# Patient Record
Sex: Female | Born: 2000 | Race: Black or African American | Hispanic: No | Marital: Single
Health system: Midwestern US, Community
[De-identification: ages and names within clinical notes are randomized; demographics above are authoritative.]

## PROBLEM LIST (undated history)

## (undated) DIAGNOSIS — Z789 Other specified health status: Secondary | ICD-10-CM

---
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
## 2020-04-09 ENCOUNTER — Other Ambulatory Visit: Payer: Self-pay

## 2020-04-09 DIAGNOSIS — Z793 Long term (current) use of hormonal contraceptives: Secondary | ICD-10-CM

## 2020-04-09 DIAGNOSIS — R0781 Pleurodynia: Secondary | ICD-10-CM | POA: Diagnosis not present

## 2020-04-09 DIAGNOSIS — R509 Fever, unspecified: Secondary | ICD-10-CM | POA: Diagnosis present

## 2020-04-09 DIAGNOSIS — I2699 Other pulmonary embolism without acute cor pulmonale: Principal | ICD-10-CM | POA: Diagnosis present

## 2020-04-09 DIAGNOSIS — Z20822 Contact with and (suspected) exposure to covid-19: Secondary | ICD-10-CM | POA: Diagnosis present

## 2020-04-09 DIAGNOSIS — D72829 Elevated white blood cell count, unspecified: Secondary | ICD-10-CM | POA: Diagnosis present

## 2020-04-09 DIAGNOSIS — Z836 Family history of other diseases of the respiratory system: Secondary | ICD-10-CM

## 2020-04-10 ENCOUNTER — Other Ambulatory Visit: Payer: Self-pay

## 2020-04-10 ENCOUNTER — Emergency Department (HOSPITAL_COMMUNITY): Payer: No Typology Code available for payment source

## 2020-04-10 ENCOUNTER — Encounter (HOSPITAL_COMMUNITY): Payer: Self-pay | Admitting: *Deleted

## 2020-04-10 ENCOUNTER — Inpatient Hospital Stay (HOSPITAL_COMMUNITY)
Admission: EM | Admit: 2020-04-10 | Discharge: 2020-04-12 | DRG: 176 | Disposition: A | Discharge status: Home / Self Care | Payer: No Typology Code available for payment source | Attending: Internal Medicine | Admitting: Internal Medicine

## 2020-04-10 DIAGNOSIS — I2699 Other pulmonary embolism without acute cor pulmonale: Secondary | ICD-10-CM | POA: Diagnosis not present

## 2020-04-10 HISTORY — DX: Other specified health status: Z78.9

## 2020-04-10 LAB — COMPREHENSIVE METABOLIC PANEL
ALT: 71 U/L — ABNORMAL HIGH (ref 0–44)
AST: 42 U/L — ABNORMAL HIGH (ref 15–41)
Albumin: 3.9 g/dL (ref 3.5–5.0)
Alkaline Phosphatase: 76 U/L (ref 38–126)
Anion gap: 12 (ref 5–15)
BUN: 11 mg/dL (ref 6–20)
CO2: 22 mmol/L (ref 22–32)
Calcium: 9.3 mg/dL (ref 8.9–10.3)
Chloride: 106 mmol/L (ref 98–111)
Creatinine, Ser: 0.74 mg/dL (ref 0.44–1.00)
GFR calc Af Amer: >60 mL/min (ref >60)
GFR calc non Af Amer: >60 mL/min (ref >60)
Glucose, Bld: 100 mg/dL — ABNORMAL HIGH (ref 70–99)
Potassium: 3.8 mmol/L (ref 3.5–5.1)
Sodium: 140 mmol/L (ref 135–145)
Total Bilirubin: 0.8 mg/dL (ref 0.3–1.2)
Total Protein: 7.6 g/dL (ref 6.5–8.1)

## 2020-04-10 LAB — CBC WITH DIFFERENTIAL/PLATELET
Abs Immature Granulocytes: 0.03 10*3/uL (ref 0.00–0.07)
Basophils Absolute: 0 10*3/uL (ref 0.0–0.1)
Basophils Relative: 0 %
Eosinophils Absolute: 0.3 10*3/uL (ref 0.0–0.5)
Eosinophils Relative: 2 %
HCT: 39.4 % (ref 36.0–46.0)
Hemoglobin: 12.6 g/dL (ref 12.0–15.0)
Immature Granulocytes: 0 %
Lymphocytes Relative: 27 %
Lymphs Abs: 3.2 10*3/uL (ref 0.7–4.0)
MCH: 27.8 pg (ref 26.0–34.0)
MCHC: 32 g/dL (ref 30.0–36.0)
MCV: 87 fL (ref 80.0–100.0)
Monocytes Absolute: 1.2 10*3/uL — ABNORMAL HIGH (ref 0.1–1.0)
Monocytes Relative: 10 %
Neutro Abs: 7.1 10*3/uL (ref 1.7–7.7)
Neutrophils Relative %: 61 %
Platelets: 224 10*3/uL (ref 150–400)
RBC: 4.53 MIL/uL (ref 3.87–5.11)
RDW: 13.3 % (ref 11.5–15.5)
WBC: 11.9 10*3/uL — ABNORMAL HIGH (ref 4.0–10.5)
nRBC: 0 % (ref 0.0–0.2)

## 2020-04-10 LAB — SARS CORONAVIRUS 2 BY RT PCR (HOSPITAL ORDER, PERFORMED IN ~~LOC~~ HOSPITAL LAB): SARS Coronavirus 2: NEGATIVE

## 2020-04-10 LAB — HEPARIN LEVEL (UNFRACTIONATED)
Heparin Unfractionated: 0.74 IU/mL — ABNORMAL HIGH (ref 0.30–0.70)
Heparin Unfractionated: 0.63 IU/mL (ref 0.30–0.70)
Heparin Unfractionated: 0.58 IU/mL (ref 0.30–0.70)

## 2020-04-10 LAB — PROTIME-INR
INR: 1.2 (ref 0.8–1.2)
Prothrombin Time: 14.6 seconds (ref 11.4–15.2)

## 2020-04-10 LAB — BRAIN NATRIURETIC PEPTIDE: B Natriuretic Peptide: 18.2 pg/mL (ref 0.0–100.0)

## 2020-04-10 LAB — TROPONIN I (HIGH SENSITIVITY): Troponin I (High Sensitivity): <2 ng/L (ref <18)

## 2020-04-10 LAB — D-DIMER, QUANTITATIVE: D-Dimer, Quant: 1.31 ug/mL-FEU — ABNORMAL HIGH (ref 0.00–0.50)

## 2020-04-10 LAB — HIV ANTIBODY (ROUTINE TESTING W REFLEX): HIV Screen 4th Generation wRfx: NONREACTIVE

## 2020-04-10 LAB — APTT: aPTT: 31 seconds (ref 24–36)

## 2020-04-10 LAB — LIPASE, BLOOD: Lipase: 25 U/L (ref 11–51)

## 2020-04-10 MED ORDER — ONDANSETRON HCL 4 MG/2ML IJ SOLN
4.0000 mg | Freq: Once | INTRAMUSCULAR | Status: AC
  Administered 2020-04-10: 4 mg via INTRAVENOUS
  Filled 2020-04-10: qty 2

## 2020-04-10 MED ORDER — SODIUM CHLORIDE 0.9% FLUSH
3.0000 mL | Freq: Two times a day (BID) | INTRAVENOUS | Status: DC
  Administered 2020-04-10 – 2020-04-12 (×3): 3 mL via INTRAVENOUS

## 2020-04-10 MED ORDER — ONDANSETRON HCL 4 MG/2ML IJ SOLN
4.0000 mg | Freq: Four times a day (QID) | INTRAMUSCULAR | Status: DC | PRN
  Administered 2020-04-10 (×2): 4 mg via INTRAVENOUS
  Filled 2020-04-10 (×2): qty 2

## 2020-04-10 MED ORDER — ONDANSETRON HCL 4 MG PO TABS: 4.0000 mg | ORAL_TABLET | Freq: Four times a day (QID) | ORAL | Status: DC | PRN

## 2020-04-10 MED ORDER — MORPHINE SULFATE (PF) 4 MG/ML IV SOLN
4.0000 mg | Freq: Once | INTRAVENOUS | Status: AC
  Administered 2020-04-10: 4 mg via INTRAVENOUS
  Filled 2020-04-10: qty 1

## 2020-04-10 MED ORDER — SODIUM CHLORIDE (PF) 0.9 % IJ SOLN
INTRAMUSCULAR | Status: AC
  Filled 2020-04-10: qty 50

## 2020-04-10 MED ORDER — SODIUM CHLORIDE 0.9% FLUSH: 3.0000 mL | INTRAVENOUS | Status: DC | PRN

## 2020-04-10 MED ORDER — ACETAMINOPHEN 650 MG RE SUPP: 650.0000 mg | Freq: Four times a day (QID) | RECTAL | Status: DC | PRN

## 2020-04-10 MED ORDER — HEPARIN (PORCINE) 25000 UT/250ML-% IV SOLN
1100.0000 [IU]/h | INTRAVENOUS | Status: AC
  Administered 2020-04-10 (×2): 1150 [IU]/h via INTRAVENOUS
  Filled 2020-04-10 (×2): qty 250

## 2020-04-10 MED ORDER — SODIUM CHLORIDE 0.9 % IV SOLN: 250.0000 mL | INTRAVENOUS | Status: DC | PRN

## 2020-04-10 MED ORDER — HYDROCODONE-ACETAMINOPHEN 5-325 MG PO TABS
1.0000 | ORAL_TABLET | Freq: Four times a day (QID) | ORAL | Status: DC | PRN
  Administered 2020-04-10: 1 via ORAL
  Filled 2020-04-10: qty 1

## 2020-04-10 MED ORDER — HEPARIN BOLUS VIA INFUSION
3000.0000 [IU] | Freq: Once | INTRAVENOUS | Status: AC
  Administered 2020-04-10: 3000 [IU] via INTRAVENOUS
  Filled 2020-04-10: qty 3000

## 2020-04-10 MED ORDER — SENNOSIDES-DOCUSATE SODIUM 8.6-50 MG PO TABS: 1.0000 | ORAL_TABLET | Freq: Every evening | ORAL | Status: DC | PRN

## 2020-04-10 MED ORDER — HYDROCODONE-ACETAMINOPHEN 5-325 MG PO TABS
1.0000 | ORAL_TABLET | ORAL | Status: DC | PRN
  Administered 2020-04-10 – 2020-04-11 (×4): 1 via ORAL
  Filled 2020-04-10 (×4): qty 1

## 2020-04-10 MED ORDER — IOHEXOL 350 MG/ML SOLN
100.0000 mL | Freq: Once | INTRAVENOUS | Status: AC | PRN
  Administered 2020-04-10: 100 mL via INTRAVENOUS

## 2020-04-10 MED ORDER — HYDROCODONE-ACETAMINOPHEN 5-325 MG PO TABS
1.0000 | ORAL_TABLET | ORAL | Status: DC | PRN
  Administered 2020-04-10: 1 via ORAL
  Filled 2020-04-10: qty 1

## 2020-04-10 MED ORDER — SODIUM CHLORIDE 0.9% FLUSH
3.0000 mL | Freq: Two times a day (BID) | INTRAVENOUS | Status: DC
  Administered 2020-04-11: 3 mL via INTRAVENOUS

## 2020-04-10 MED ORDER — ACETAMINOPHEN 325 MG PO TABS
650.0000 mg | ORAL_TABLET | Freq: Four times a day (QID) | ORAL | Status: DC | PRN
  Administered 2020-04-10 – 2020-04-11 (×2): 650 mg via ORAL
  Filled 2020-04-10 (×2): qty 2

## 2020-04-10 MED ORDER — MORPHINE SULFATE (PF) 4 MG/ML IV SOLN
4.0000 mg | INTRAVENOUS | Status: DC | PRN
  Administered 2020-04-10 (×2): 4 mg via INTRAVENOUS
  Filled 2020-04-10 (×2): qty 1

## 2020-04-10 MED ORDER — BISACODYL 5 MG PO TBEC: 5.0000 mg | DELAYED_RELEASE_TABLET | Freq: Every day | ORAL | Status: DC | PRN

## 2020-04-10 NOTE — Progress Notes (Signed)
Patient seen and examined personally, I reviewed the chart, history and physical and admission note, done by admitting physician this morning and agree with the same with following addendum.  Please refer to the morning admission note for more detailed plan of care.  On exam she is alert awake oriented, lungs are clear on exam has pleuritic chest pain on right lower chest. No leg edema. No abdominal tenderness.  C/o right lower chest pain 5-6/10 just with movement. On RA.   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  Reports she has been on on Nuvaring (etonogestrel/ethinyl estradiol) for birth control q 4 wk- stays for 21 days and off for 1 wk. And has been on that since 2016  PE with infarct with pleuritic chest pain: Continue IV heparin infusion we discussed about conversion to DOAC preferably Xarelto.  No recent surgery injury or immobilization but she does take Nuvaring birth control vaginal ring  With etonogestrel/ethinyl estradiol-and I suspect this is likely the etiology.  Patient going to follow-up with her OB/GYN regarding discontinuation.  Well to seek alternative method of birth control.  She will need follow-up with hematology for further work-up if warranted has family history of PE in maternal grandfather.  Patient remains hospitalized for ongoing treatment of PE with IV heparin, pain control.

## 2020-04-10 NOTE — Progress Notes (Signed)
NUVARING removed per pt GYN in [place], Dr. Baxter Kail [phone]. Pt self removed. SRP, RN

## 2020-04-10 NOTE — Progress Notes (Signed)
ANTICOAGULATION CONSULT NOTE - Follow Up Consult  Pharmacy Consult for Heparin Indication: pulmonary embolus  No Known Allergies  Patient Measurements: Height: 5\' 2"  (157.5 cm) Weight: 58.2 kg (128 lb 4.9 oz) IBW/kg (Calculated) : 50.1 Heparin Dosing Weight: actual weight  Vital Signs: Temp: 99.3 F (37.4 C) (05/24 1758) Temp Source: Oral (05/24 1425) BP: 114/75 (05/24 1758) Pulse Rate: 85 (05/24 1758)  Labs: Recent Labs    04/10/20 0129 04/10/20 0455 04/10/20 04/12/20 04/10/20 1234 04/10/20 1822  HGB 12.6  --   --   --   --   HCT 39.4  --   --   --   --   PLT 224  --   --   --   --   APTT  --  31  --   --   --   LABPROT  --  14.6  --   --   --   INR  --  1.2  --   --   --   HEPARINUNFRC  --   --  0.74* 0.63 0.58  CREATININE 0.74  --   --   --   --   TROPONINIHS  --   --  <2  --   --     Estimated Creatinine Clearance: 88 mL/min (by C-G formula based on SCr of 0.74 mg/dL).   Medications:  Scheduled:  . sodium chloride flush  3 mL Intravenous Q12H  . sodium chloride flush  3 mL Intravenous Q12H   Infusions:  . sodium chloride    . heparin 1,150 Units/hr (04/10/20 0504)   PRN: sodium chloride, acetaminophen **OR** acetaminophen, bisacodyl, HYDROcodone-acetaminophen, morphine injection, ondansetron **OR** ondansetron (ZOFRAN) IV, senna-docusate, sodium chloride flush  Assessment: 19 yo female admitted with +PE in RLL segmental artery with infarction.  Pharmacy is consulted to dose IV heparin.  Today, 04/10/2020  Heparin level 0.63 is therapeutic on 1150 units/hr  CBC wnl  No bleeding or complications reported  2nd shift follow-up:  Confirmatory Heparin level 0.58 therapeutic on 1150 units/hr  No complications of therapy noted  Goal of Therapy:  Heparin level 0.3-0.7 units/ml Monitor platelets by anticoagulation protocol: Yes   Plan:   Continue IV heparin infusion at 1150 units/hr  Daily heparin level and CBC  Monitor for signs/symptoms of  bleeding  F/u transition to oral anticoagulant  04/12/2020, PharmD 04/10/2020,8:12 PM

## 2020-04-10 NOTE — H&P (Signed)
History and Physical    Briana Villegas LPF:[id number] DOB: 19-Jul-2001 DOA: 04/10/2020  PCP: System, Pcp Not In   Patient coming from: Home   Chief Complaint: Pain under right breast   HPI: Briana Villegas is a 19 y.o. female denies any significant past medical history and now presents the emergency department with 1 day of pain under her right breast.  Patient reports that she been in her usual state of health until she noticed some pain under the right breast yesterday morning.  Pain has been constant, radiating around to her back, worse with deep breath, and without any associated shortness of breath, cough, or lightheadedness.  She denies any lower extremity swelling or tenderness.  She denies any recent injury, surgery, or prolonged immobilization.  She is a non-smoker.  She reports a family history of PE in her maternal grandfather.  No history of significant bleeding.  ED Course: Upon arrival to the ED, patient is found to be afebrile, saturating 98% on room air, normal heart and respiratory rates, and stable blood pressure.  CBC notable for mild leukocytosis and chemistry panel with mild elevation in transaminases.  Chest x-ray with no acute cardiopulmonary disease.  CTA chest notable for pulmonary embolism involving the right lower lobe segmental artery with suspected infarct.  COVID-19 screening test was negative.  Patient was started on IV heparin in the ED.  Review of Systems:  All other systems reviewed and apart from HPI, are negative.  Past Medical History:  Diagnosis Date  . Medical history non-contributory     History reviewed. No pertinent surgical history.   reports that she has never smoked. She has never used smokeless tobacco. She reports current alcohol use. She reports previous drug use.  No Known Allergies  Family History  Problem Relation Age of Onset  . Pulmonary embolism Maternal Grandfather      Prior to Admission medications   Medication Sig Start  Date End Date Taking? Authorizing Provider  etonogestrel-ethinyl estradiol (NUVARING) 0.12-0.015 MG/24HR vaginal ring Place 1 each vaginally every 21 ( twenty-one) days. Insert 1 ring vaginally for 3 weeks then remove for 1 week 03/06/20  Yes [provider]  ibuprofen (ADVIL) 200 MG tablet Take 400 mg by mouth every 6 (six) hours as needed for fever, headache, mild pain, moderate pain or cramping.   Yes [provider]    Physical Exam: Vitals:   04/10/20 0002 04/10/20 0003 04/10/20 0206 04/10/20 0400  BP: 123/77  114/76 117/85  Pulse: 89  80 85  Resp: 16  16 19   Temp: 98.1 F (36.7 C)     TempSrc: Oral     SpO2: 99%  99% 98%  Weight:  59.4 kg    Height:  5\' 2"  (1.575 m)       Constitutional: NAD, calm  Eyes: PERTLA, lids and conjunctivae normal ENMT: Mucous membranes are moist. Posterior pharynx clear of any exudate or lesions.   Neck: normal, supple, no masses, no thyromegaly Respiratory: no wheezing, no crackles. No accessory muscle use.  Cardiovascular: Rate ~80 and regular. No extremity edema. No significant JVD. Abdomen: No distension, no tenderness, soft. Bowel sounds active.  Musculoskeletal: no clubbing / cyanosis. No joint deformity upper and lower extremities.   Skin: no significant rashes, lesions, ulcers. Warm, dry, well-perfused. Neurologic: no facial asymmetry. Sensation intact. Moving all extremities.  Psychiatric: Alert and oriented to person, place, and situation. Pleasant and cooperative.    Labs and Imaging on Admission: I have personally reviewed following  labs and imaging studies  CBC: Recent Labs  Lab 04/10/20 0129  WBC 11.9*  NEUTROABS 7.1  HGB 12.6  HCT 39.4  MCV 87.0  PLT 224   Basic Metabolic Panel: Recent Labs  Lab 04/10/20 0129  NA 140  K 3.8  CL 106  CO2 22  GLUCOSE 100*  BUN 11  CREATININE 0.74  CALCIUM 9.3   GFR: Estimated Creatinine Clearance: 88 mL/min (by C-G formula based on SCr of 0.74 mg/dL). Liver  Function Tests: Recent Labs  Lab 04/10/20 0129  AST 42*  ALT 71*  ALKPHOS 76  BILITOT 0.8  PROT 7.6  ALBUMIN 3.9   Recent Labs  Lab 04/10/20 0129  LIPASE 25   No results for input(s): AMMONIA in the last 168 hours. Coagulation Profile: No results for input(s): INR, PROTIME in the last 168 hours. Cardiac Enzymes: No results for input(s): CKTOTAL, CKMB, CKMBINDEX, TROPONINI in the last 168 hours. BNP (last 3 results) No results for input(s): PROBNP in the last 8760 hours. HbA1C: No results for input(s): HGBA1C in the last 72 hours. CBG: No results for input(s): GLUCAP in the last 168 hours. Lipid Profile: No results for input(s): CHOL, HDL, LDLCALC, TRIG, CHOLHDL, LDLDIRECT in the last 72 hours. Thyroid Function Tests: No results for input(s): TSH, T4TOTAL, FREET4, T3FREE, THYROIDAB in the last 72 hours. Anemia Panel: No results for input(s): VITAMINB12, FOLATE, FERRITIN, TIBC, IRON, RETICCTPCT in the last 72 hours. Urine analysis: No results found for: COLORURINE, APPEARANCEUR, LABSPEC, PHURINE, GLUCOSEU, HGBUR, BILIRUBINUR, KETONESUR, PROTEINUR, UROBILINOGEN, NITRITE, LEUKOCYTESUR Sepsis Labs: @LABRCNTIP (procalcitonin:4,lacticidven:4) ) Recent Results (from the past 240 hour(s))  SARS Coronavirus 2 by RT PCR (hospital order, performed in [facility] hospital lab) Nasopharyngeal Nasopharyngeal Swab     Status: None   Collection Time: 04/10/20  2:39 AM   Specimen: Nasopharyngeal Swab  Result Value Ref Range Status   SARS Coronavirus 2 NEGATIVE NEGATIVE Final    Comment: (NOTE) SARS-CoV-2 target nucleic acids are NOT DETECTED. The SARS-CoV-2 RNA is generally detectable in upper and lower respiratory specimens during the acute phase of infection. The lowest concentration of SARS-CoV-2 viral copies this assay can detect is 250 copies / mL. A negative result does not preclude SARS-CoV-2 infection and should not be used as the sole basis for treatment or other patient  management decisions.  A negative result may occur with improper specimen collection / handling, submission of specimen other than nasopharyngeal swab, presence of viral mutation(s) within the areas targeted by this assay, and inadequate number of viral copies (<250 copies / mL). A negative result must be combined with clinical observations, patient history, and epidemiological information. Fact Sheet for Patients:   04/12/20 Fact Sheet for Healthcare Providers: BoilerBrush.com.cy This test is not yet approved or cleared  by the https://pope.com/ FDA and has been authorized for detection and/or diagnosis of SARS-CoV-2 by FDA under an Emergency Use Authorization (EUA).  This EUA will remain in effect (meaning this test can be used) for the duration of the COVID-19 declaration under Section 564(b)(1) of the Act, 21 U.S.C. section 360bbb-3(b)(1), unless the authorization is terminated or revoked sooner. Performed at [facility], [address] [place], [place]      Radiological Exams on Admission: DG Chest 2 View  Result Date: 04/10/2020 CLINICAL DATA:  Pleuritic chest pain. EXAM: CHEST - 2 VIEW COMPARISON:  None. FINDINGS: The heart size and mediastinal contours are within normal limits. Both lungs are clear. The visualized skeletal structures are  unremarkable. IMPRESSION: No active cardiopulmonary disease. Electronically Signed   By: Constance Holster M.D.   On: 04/10/2020 02:07   CT Angio Chest PE W and/or Wo Contrast  Result Date: 04/10/2020 CLINICAL DATA:  Positive D-dimer, right pleuritic pain, elevated liver enzymes EXAM: CT ANGIOGRAPHY CHEST WITH CONTRAST TECHNIQUE: Multidetector CT imaging of the chest was performed using the standard protocol during bolus administration of intravenous contrast. Multiplanar CT image reconstructions and MIPs were obtained to evaluate the vascular anatomy. CONTRAST:   133mL OMNIPAQUE IOHEXOL 350 MG/ML SOLN COMPARISON:  Chest radiograph 04/10/2020 FINDINGS: Cardiovascular: Satisfactory opacification the pulmonary arteries. Filling defect in the posterior basal segmental pulmonary artery of the right lower lobe with extension into the subsegmental branches. No proximal propagation or other significant filling defects are seen within the right or left lung. Central pulmonary arteries are normal caliber. Normal heart size. No pericardial effusion. The aorta is normal caliber. Normal 3 vessel branching of the aortic arch. Normal heart size. No pericardial effusion. Mediastinum/Nodes: No mediastinal fluid or gas. Normal thyroid gland and thoracic inlet. No acute abnormality of the trachea or esophagus. No worrisome mediastinal, hilar or axillary adenopathy. Lungs/Pleura: Wedge-shaped region of opacity in the peripheral basal segment of the right lower lobe, corresponding well to the vascular distribution of the pulmonary artery filling defect as detailed above, is most compatible with a pulmonary infarct. Lungs are otherwise clear. No pneumothorax or effusion. No concerning pulmonary nodules or masses. Upper Abdomen: No acute abnormalities present in the visualized portions of the upper abdomen. Musculoskeletal: No acute osseous abnormality or suspicious osseous lesion. Review of the MIP images confirms the above findings. IMPRESSION: 1. Acute pulmonary artery filling defect in the posterior basal segmental pulmonary artery of the right lower lobe with extension into the subsegmental branches. 2. Wedge-shaped region of opacity in the peripheral basal segment of the right lower lobe corresponding well to the vascular distribution of the pulmonary artery filling defect most compatible with a pulmonary infarct. Critical Value/emergent results were called by telephone at the time of interpretation on 04/10/2020 at 3:42 am to provider [facility] , who verbally acknowledged these  results. Electronically Signed   By: Lovena Le M.D.   On: 04/10/2020 03:43    Assessment/Plan   1. Pulmonary embolism  - Presents with one day of pleuritic pain and is found to have PE involving RLL segmental artery with infarction   - No recent surgery, injury, or immobilization, and no smoking but reports hx of PE in maternal grandfather - She was started on IV heparin in ED  - She is hemodynamically stable, not tachypneic or hypoxic, there is no evidence for DVT, and pain well-controlled after one dose analgesic - Continue pain-control, anticoagulation      DVT prophylaxis: IV heparin  Code Status: Full  Family Communication: Discussed with patient  Disposition Plan:  Patient is from: Home  Anticipated d/c is to: Home  Anticipated d/c date is: 04/11/20 Patient currently: On IV heparin, will need to transition to oral anticoagulant  Consults called: None  Admission status: Observation     Vianne Bulls, MD [facility] Pager: See [email or web address]  If 7AM-7PM, please contact the daytime attending [email or web address]  04/10/2020, 5:15 AM

## 2020-04-10 NOTE — Progress Notes (Signed)
ANTICOAGULATION CONSULT NOTE - Follow Up Consult  Pharmacy Consult for Heparin Indication: pulmonary embolus  No Known Allergies  Patient Measurements: Height: 5\' 2"  (157.5 cm) Weight: 58.2 kg (128 lb 4.9 oz) IBW/kg (Calculated) : 50.1 Heparin Dosing Weight: actual weight  Vital Signs: Temp: 98.2 F (36.8 C) (05/24 1027) Temp Source: Oral (05/24 0607) BP: 121/92 (05/24 1027) Pulse Rate: 87 (05/24 1027)  Labs: Recent Labs    04/10/20 0129 04/10/20 0455 04/10/20 04/12/20 04/10/20 1234  HGB 12.6  --   --   --   HCT 39.4  --   --   --   PLT 224  --   --   --   APTT  --  31  --   --   LABPROT  --  14.6  --   --   INR  --  1.2  --   --   HEPARINUNFRC  --   --  0.74* 0.63  CREATININE 0.74  --   --   --   TROPONINIHS  --   --  <2  --     Estimated Creatinine Clearance: 88 mL/min (by C-G formula based on SCr of 0.74 mg/dL).   Medications:  Scheduled:  . sodium chloride (PF)      . sodium chloride flush  3 mL Intravenous Q12H  . sodium chloride flush  3 mL Intravenous Q12H   Infusions:  . sodium chloride    . heparin 1,150 Units/hr (04/10/20 0504)   PRN: sodium chloride, acetaminophen **OR** acetaminophen, bisacodyl, HYDROcodone-acetaminophen, morphine injection, ondansetron **OR** ondansetron (ZOFRAN) IV, senna-docusate, sodium chloride flush  Assessment: 19 yo female admitted with +PE in RLL segmental artery with infarction.  Pharmacy is consulted to dose IV heparin.  Today, 04/10/2020  Heparin level 0.63 is therapeutic on 1150 units/hr  CBC wnl  No bleeding or complications reported  Goal of Therapy:  Heparin level 0.3-0.7 units/ml Monitor platelets by anticoagulation protocol: Yes   Plan:   Continue IV heparin infusion at 1150 units/hr  Recheck heparin level in 6hrs to verify therapeutic  Daily heparin level and CBC  Monitor for signs/symptoms of bleeding  F/u transition to oral anticoagulant  04/12/2020, PharmD, BCPS Pharmacy:  [phone] 04/10/2020,1:19 PM

## 2020-04-10 NOTE — ED Triage Notes (Signed)
Pt report she has a pain under her right breast that radiates around to her back area, worse to movement and inspiration since waking up yesterday. Denies cough, fevers. Took ibuprofen.

## 2020-04-10 NOTE — TOC Initial Note (Signed)
Transition of Care [facility]) - Initial/Assessment Note    Patient Details  Name: Briana Villegas MRN: [id number] Date of Birth: 03-02-2001  [facility] CM/SW Contact:    Armanda Heritage, RN Phone Number: 04/10/2020, 3:12 PM  Clinical Narrative:     Cm spoke with patient who reports she does not have pcp at this time.  CM provided information about [facility] and scheduled pcp appointment, information placed on AVS.                Expected Discharge Plan: Home/Self Care Barriers to Discharge: Continued Medical Work up   Patient Goals and CMS Choice Patient states their goals for this hospitalization and ongoing recovery are:: to get better      Expected Discharge Plan and Services Expected Discharge Plan: Home/Self Care   Discharge Planning Services: CM Consult                     DME Arranged: N/A DME Agency: NA       HH Arranged: NA HH Agency: NA        Prior Living Arrangements/Services     Patient language and need for interpreter reviewed:: Yes Do you feel safe going back to the place where you live?: Yes      Need for Family Participation in Patient Care: Yes (Comment) Care giver support system in place?: Yes (comment)   Criminal Activity/Legal Involvement Pertinent to Current Situation/Hospitalization: No - Comment as needed  Activities of Daily Living Home Assistive Devices/Equipment: None ADL Screening (condition at time of admission) Patient's cognitive ability adequate to safely complete daily activities?: Yes Is the patient deaf or have difficulty hearing?: No Does the patient have difficulty seeing, even when wearing glasses/contacts?: No Does the patient have difficulty concentrating, remembering, or making decisions?: No Patient able to express need for assistance with ADLs?: Yes Does the patient have difficulty dressing or bathing?: No Independently performs ADLs?: Yes (appropriate for developmental age) Does the patient have  difficulty walking or climbing stairs?: No Weakness of Legs: None Weakness of Arms/Hands: None  Permission Sought/Granted                  Emotional Assessment Appearance:: Appears stated age Attitude/Demeanor/Rapport: Engaged Affect (typically observed): Accepting Orientation: : Oriented to Self, Oriented to Place, Oriented to  Time, Oriented to Situation   Psych Involvement: No (comment)  Admission diagnosis:  Pulmonary embolism (HCC) [I26.99] Other acute pulmonary embolism without acute cor pulmonale (HCC) [I26.99] Patient Active Problem List   Diagnosis Date Noted  . Pulmonary embolism (HCC) 04/10/2020   PCP:  System, Pcp Not In Pharmacy:   [id number] [place], [place] - [address] AT [facility] [address] [place] [address] Phone: [phone] Fax: [phone]     Social Determinants of Health (SDOH) Interventions    Readmission Risk Interventions No flowsheet data found.

## 2020-04-10 NOTE — ED Provider Notes (Signed)
[facility]-EMERGENCY DEPT Provider Note   CSN: [id number] Arrival date & time: 04/09/20  2338     History Chief Complaint  Patient presents with  . Rib pain    Briana Villegas is a 19 y.o. female with a hx of no major medical problems presents to the Emergency Department complaining of gradual, persistent, progressively worsening right rib pain onset this morning. Associated symptoms include pain with inspiration and movement.  Patient reports the pain radiates from the right ribs around to the front of the chest and to the back around where the scapula is.  No nausea or vomiting.  No previous abdominal surgeries.  No history of gallbladder problems.  No history of asthma.  Patient uses a NuvaRing for birth control but does not take an OCP.  No other risk factors for DVT.  Resting and taking shallow breaths seems to help the pain some, movement and deep breaths make it worse.  Mild dyspnea on exertion. Patient reports she completed her second dose of the Pfizer COVID-19 vaccine approximately 2 weeks ago.  She has been feeling well since then.   The history is provided by the patient and medical records. No language interpreter was used.       History reviewed. No pertinent past medical history.  Patient Active Problem List   Diagnosis Date Noted  . Pulmonary embolism (HCC) 04/10/2020    History reviewed. No pertinent surgical history.   OB History   No obstetric history on file.     History reviewed. No pertinent family history.  Social History   Tobacco Use  . Smoking status: Never Smoker  Substance Use Topics  . Alcohol use: Not Currently  . Drug use: Not Currently    Home Medications Prior to Admission medications   Medication Sig Start Date End Date Taking? Authorizing Provider  etonogestrel-ethinyl estradiol (NUVARING) 0.12-0.015 MG/24HR vaginal ring Place 1 each vaginally every 21 ( twenty-one) days. Insert 1 ring vaginally for 3 weeks  then remove for 1 week 03/06/20  Yes [provider]  ibuprofen (ADVIL) 200 MG tablet Take 400 mg by mouth every 6 (six) hours as needed for fever, headache, mild pain, moderate pain or cramping.   Yes [provider]    Allergies    Patient has no known allergies.  Review of Systems   Review of Systems  Constitutional: Negative for appetite change, diaphoresis, fatigue, fever and unexpected weight change.  HENT: Negative for mouth sores.   Eyes: Negative for visual disturbance.  Respiratory: Positive for shortness of breath. Negative for cough, chest tightness and wheezing.   Cardiovascular: Positive for chest pain ( right sided).  Gastrointestinal: Negative for abdominal pain, constipation, diarrhea, nausea and vomiting.  Endocrine: Negative for polydipsia, polyphagia and polyuria.  Genitourinary: Negative for dysuria, frequency, hematuria and urgency.  Musculoskeletal: Negative for back pain and neck stiffness.  Skin: Negative for rash.  Allergic/Immunologic: Negative for immunocompromised state.  Neurological: Negative for syncope, light-headedness and headaches.  Hematological: Does not bruise/bleed easily.  Psychiatric/Behavioral: Negative for sleep disturbance. The patient is not nervous/anxious.     Physical Exam Updated Vital Signs BP 114/76   Pulse 80   Temp 98.1 F (36.7 C) (Oral)   Resp 16   Ht 5\' 2"  (1.575 m)   Wt 59.4 kg   LMP 04/07/2020   SpO2 99%   BMI 23.96 kg/m   Physical Exam Vitals and nursing note reviewed.  Constitutional:      General: She is  not in acute distress.    Appearance: She is not diaphoretic.  HENT:     Head: Normocephalic.     Mouth/Throat:     Mouth: Mucous membranes are moist.  Eyes:     General: No scleral icterus.    Conjunctiva/sclera: Conjunctivae normal.  Cardiovascular:     Rate and Rhythm: Regular rhythm. Tachycardia present.     Pulses: Normal pulses.          Radial pulses are 2+ on the right side and  2+ on the left side.     Comments: Mild tachycardia on my exam Pulmonary:     Effort: Pulmonary effort is normal. No tachypnea, accessory muscle usage, prolonged expiration, respiratory distress or retractions.     Breath sounds: Normal breath sounds. No stridor. No wheezing, rhonchi or rales.     Comments: Equal chest rise. No increased work of breathing; however patient is taking shallow breaths Chest:     Chest wall: No tenderness.     Comments: Tenderness to palpation along the right ribs.  No rash, vesicles, ecchymosis or flail segment. Abdominal:     General: There is no distension.     Palpations: Abdomen is soft.     Tenderness: There is no abdominal tenderness. There is no guarding or rebound. Negative signs include Murphy's sign.     Comments: Soft and nontender.  Specifically no Murphy sign.  Musculoskeletal:        General: No tenderness.     Cervical back: Normal range of motion.     Right lower leg: No edema.     Left lower leg: No edema.     Comments: Moves all extremities equally and without difficulty.  Skin:    General: Skin is warm and dry.     Capillary Refill: Capillary refill takes less than 2 seconds.  Neurological:     Mental Status: She is alert.     GCS: GCS eye subscore is 4. GCS verbal subscore is 5. GCS motor subscore is 6.     Comments: Speech is clear and goal oriented.  Psychiatric:        Mood and Affect: Mood normal.     ED Results / Procedures / Treatments   Labs (all labs ordered are listed, but only abnormal results are displayed) Labs Reviewed  CBC WITH DIFFERENTIAL/PLATELET - Abnormal; Notable for the following components:      Result Value   WBC 11.9 (*)    Monocytes Absolute 1.2 (*)    All other components within normal limits  COMPREHENSIVE METABOLIC PANEL - Abnormal; Notable for the following components:   Glucose, Bld 100 (*)    AST 42 (*)    ALT 71 (*)    All other components within normal limits  D-DIMER, QUANTITATIVE (NOT  AT [facility]) - Abnormal; Notable for the following components:   D-Dimer, Quant 1.31 (*)    All other components within normal limits  SARS CORONAVIRUS 2 BY RT PCR (HOSPITAL ORDER, PERFORMED IN [facility])  LIPASE, BLOOD  PROTIME-INR  APTT  HEPARIN LEVEL (UNFRACTIONATED)  BRAIN NATRIURETIC PEPTIDE  I-STAT BETA HCG BLOOD, ED ([facility], [facility], [facility] ONLY)  TROPONIN I (HIGH SENSITIVITY)    EKG  ED ECG REPORT   Date: 04/10/2020  Rate: 88  Rhythm: normal sinus rhythm  QRS Axis: normal  Intervals: normal  ST/T Wave abnormalities: nonspecific T wave changes  Conduction Disutrbances:none  Narrative Interpretation: Sinus rhythm, borderline T abnormalities, anterior leads.  Old  EKG Reviewed: none available  I have personally reviewed the EKG tracing and agree with the computerized printout as noted.   Radiology DG Chest 2 View  Result Date: 04/10/2020 CLINICAL DATA:  Pleuritic chest pain. EXAM: CHEST - 2 VIEW COMPARISON:  None. FINDINGS: The heart size and mediastinal contours are within normal limits. Both lungs are clear. The visualized skeletal structures are unremarkable. IMPRESSION: No active cardiopulmonary disease. Electronically Signed   By: Constance Holster M.D.   On: 04/10/2020 02:07   CT Angio Chest PE W and/or Wo Contrast  Result Date: 04/10/2020 CLINICAL DATA:  Positive D-dimer, right pleuritic pain, elevated liver enzymes EXAM: CT ANGIOGRAPHY CHEST WITH CONTRAST TECHNIQUE: Multidetector CT imaging of the chest was performed using the standard protocol during bolus administration of intravenous contrast. Multiplanar CT image reconstructions and MIPs were obtained to evaluate the vascular anatomy. CONTRAST:  161mL OMNIPAQUE IOHEXOL 350 MG/ML SOLN COMPARISON:  Chest radiograph 04/10/2020 FINDINGS: Cardiovascular: Satisfactory opacification the pulmonary arteries. Filling defect in the posterior basal segmental pulmonary artery of the right lower lobe with extension into the  subsegmental branches. No proximal propagation or other significant filling defects are seen within the right or left lung. Central pulmonary arteries are normal caliber. Normal heart size. No pericardial effusion. The aorta is normal caliber. Normal 3 vessel branching of the aortic arch. Normal heart size. No pericardial effusion. Mediastinum/Nodes: No mediastinal fluid or gas. Normal thyroid gland and thoracic inlet. No acute abnormality of the trachea or esophagus. No worrisome mediastinal, hilar or axillary adenopathy. Lungs/Pleura: Wedge-shaped region of opacity in the peripheral basal segment of the right lower lobe, corresponding well to the vascular distribution of the pulmonary artery filling defect as detailed above, is most compatible with a pulmonary infarct. Lungs are otherwise clear. No pneumothorax or effusion. No concerning pulmonary nodules or masses. Upper Abdomen: No acute abnormalities present in the visualized portions of the upper abdomen. Musculoskeletal: No acute osseous abnormality or suspicious osseous lesion. Review of the MIP images confirms the above findings. IMPRESSION: 1. Acute pulmonary artery filling defect in the posterior basal segmental pulmonary artery of the right lower lobe with extension into the subsegmental branches. 2. Wedge-shaped region of opacity in the peripheral basal segment of the right lower lobe corresponding well to the vascular distribution of the pulmonary artery filling defect most compatible with a pulmonary infarct. Critical Value/emergent results were called by telephone at the time of interpretation on 04/10/2020 at 3:42 am to provider [facility] , who verbally acknowledged these results. Electronically Signed   By: Lovena Le M.D.   On: 04/10/2020 03:43    Procedures .Critical Care Performed by: Abigail Butts, PA-C Authorized by: Abigail Butts, PA-C   Critical care provider statement:    Critical care time (minutes):  45    Critical care time was exclusive of:  Separately billable procedures and treating other patients and teaching time   Critical care was necessary to treat or prevent imminent or life-threatening deterioration of the following conditions:  Respiratory failure   Critical care was time spent personally by me on the following activities:  Discussions with consultants, evaluation of patient's response to treatment, examination of patient, ordering and performing treatments and interventions, ordering and review of laboratory studies, ordering and review of radiographic studies, pulse oximetry, re-evaluation of patient's condition, obtaining history from patient or surrogate and review of old charts   I assumed direction of critical care for this patient from another provider in my specialty: no     (  including critical care time)  Medications Ordered in ED Medications  sodium chloride (PF) 0.9 % injection (has no administration in time range)  heparin ADULT infusion 100 units/mL (25000 units/276mL sodium chloride 0.45%) (1,150 Units/hr Intravenous New Bag/Given 04/10/20 0504)  morphine 4 MG/ML injection 4 mg (4 mg Intravenous Given 04/10/20 0251)  ondansetron (ZOFRAN) injection 4 mg (4 mg Intravenous Given 04/10/20 0250)  iohexol (OMNIPAQUE) 350 MG/ML injection 100 mL (100 mLs Intravenous Contrast Given 04/10/20 0302)  heparin bolus via infusion 3,000 Units (3,000 Units Intravenous Bolus from Bag 04/10/20 0503)    ED Course  I have reviewed the triage vital signs and the nursing notes.  Pertinent labs & imaging results that were available during my care of the patient were reviewed by me and considered in my medical decision making (see chart for details).  Clinical Course as of Apr 10 510  Mon Apr 10, 2020  0400 Discussed with Radiology - CT with evidence of right sided    [HM]    Clinical Course User Index [HM] Haruye Lainez, Boyd Kerbs   MDM Rules/Calculators/A&P                        Loa Idler was evaluated in Emergency Department on 04/10/2020 for the symptoms described in the history of present illness. She was evaluated in the context of the global COVID-19 pandemic, which necessitated consideration that the patient might be at risk for infection with the SARS-CoV-2 virus that causes COVID-19. Institutional protocols and algorithms that pertain to the evaluation of patients at risk for COVID-19 are in a state of rapid change based on information released by regulatory bodies including the CDC and federal and state organizations. These policies and algorithms were followed during the patient's care in the ED.  Presents with pleuritic right-sided chest pain.  EKG nonischemic.  Patient ultimately low risk for DVT however elevated D-dimer.  Will obtain CT angiogram.  Normal elevation in AST and ALT.  Patient reports she was drinking last night.  Mild leukocytosis noted.  Patient is vaccinated for Covid however given elevated D-dimer and liver enzymes will test for Covid as well.  Pain control given.  No hypoxia here in the emergency department.  4:44 AM CT scan shows pulmonary embolism with associated necrosis.  I personally evaluated these images.  Patient started on heparin and will be admitted as she continues to be persistently tachycardic and with continued pain.   5:11 AM Discussed patient's case with hospitalist, Dr. Antionette Char.  I have recommended admission and patient (and family if present) agree with this plan. Admitting physician will evaluate for admission.   The patient was discussed with and seen by Dr. Bebe Shaggy who agrees with the treatment plan.   Final Clinical Impression(s) / ED Diagnoses Final diagnoses:  Other acute pulmonary embolism without acute cor pulmonale [facility])    Rx / DC Orders ED Discharge Orders    None       Aarav Burgett, Boyd Kerbs 04/10/20 [id number]    Zadie Rhine, MD 04/10/20 [phone]

## 2020-04-10 NOTE — Progress Notes (Signed)
ANTICOAGULATION CONSULT NOTE - Initial Consult  Pharmacy Consult for Heparin Indication: pulmonary embolus  No Known Allergies  Patient Measurements: Height: 5\' 2"  (157.5 cm) Weight: 59.4 kg (131 lb) IBW/kg (Calculated) : 50.1 Heparin Dosing Weight:   Vital Signs: Temp: 98.1 F (36.7 C) (05/24 0002) Temp Source: Oral (05/24 0002) BP: 117/85 (05/24 0400) Pulse Rate: 85 (05/24 0400)  Labs: Recent Labs    04/10/20 0129  HGB 12.6  HCT 39.4  PLT 224  CREATININE 0.74    Estimated Creatinine Clearance: 88 mL/min (by C-G formula based on SCr of 0.74 mg/dL).   Medical History: History reviewed. No pertinent past medical history.  Medications:  Infusions:  . heparin 1,150 Units/hr (04/10/20 0504)    Assessment: Patient with new PE.  No oral anticoagulants noted on med rec. Baseline coags ordered.  Goal of Therapy:  Heparin level 0.3-0.7 units/ml Monitor platelets by anticoagulation protocol: Yes   Plan:  Heparin bolus  3000 units iv x1 Heparin drip at 1150 units/hr Daily CBC Next heparin level at 1200    [address] [place] 04/10/2020,5:10 AM

## 2020-04-11 DIAGNOSIS — Z836 Family history of other diseases of the respiratory system: Secondary | ICD-10-CM | POA: Diagnosis not present

## 2020-04-11 DIAGNOSIS — D72829 Elevated white blood cell count, unspecified: Secondary | ICD-10-CM | POA: Diagnosis present

## 2020-04-11 DIAGNOSIS — R509 Fever, unspecified: Secondary | ICD-10-CM | POA: Diagnosis present

## 2020-04-11 DIAGNOSIS — I2699 Other pulmonary embolism without acute cor pulmonale: Secondary | ICD-10-CM | POA: Diagnosis present

## 2020-04-11 DIAGNOSIS — Z793 Long term (current) use of hormonal contraceptives: Secondary | ICD-10-CM | POA: Diagnosis not present

## 2020-04-11 DIAGNOSIS — R0781 Pleurodynia: Secondary | ICD-10-CM | POA: Diagnosis present

## 2020-04-11 DIAGNOSIS — Z20822 Contact with and (suspected) exposure to covid-19: Secondary | ICD-10-CM | POA: Diagnosis present

## 2020-04-11 LAB — CBC
HCT: 41.8 % (ref 36.0–46.0)
Hemoglobin: 13.2 g/dL (ref 12.0–15.0)
MCH: 27.3 pg (ref 26.0–34.0)
MCHC: 31.6 g/dL (ref 30.0–36.0)
MCV: 86.5 fL (ref 80.0–100.0)
Platelets: 227 10*3/uL (ref 150–400)
RBC: 4.83 MIL/uL (ref 3.87–5.11)
RDW: 13.2 % (ref 11.5–15.5)
WBC: 10.9 10*3/uL — ABNORMAL HIGH (ref 4.0–10.5)
nRBC: 0 % (ref 0.0–0.2)

## 2020-04-11 LAB — HEPARIN LEVEL (UNFRACTIONATED)
Heparin Unfractionated: 0.8 IU/mL — ABNORMAL HIGH (ref 0.30–0.70)
Heparin Unfractionated: 0.7 IU/mL (ref 0.30–0.70)

## 2020-04-11 MED ORDER — RIVAROXABAN 20 MG PO TABS: 20.0000 mg | ORAL_TABLET | Freq: Every day | ORAL | Status: DC

## 2020-04-11 MED ORDER — KETOROLAC TROMETHAMINE 15 MG/ML IJ SOLN
15.0000 mg | Freq: Four times a day (QID) | INTRAMUSCULAR | Status: DC | PRN
  Administered 2020-04-11 – 2020-04-12 (×4): 15 mg via INTRAVENOUS
  Filled 2020-04-11 (×4): qty 1

## 2020-04-11 MED ORDER — KETOROLAC TROMETHAMINE 30 MG/ML IJ SOLN
30.0000 mg | Freq: Once | INTRAMUSCULAR | Status: AC
  Administered 2020-04-11: 30 mg via INTRAVENOUS
  Filled 2020-04-11: qty 1

## 2020-04-11 MED ORDER — RIVAROXABAN 15 MG PO TABS
15.0000 mg | ORAL_TABLET | Freq: Two times a day (BID) | ORAL | Status: DC
  Administered 2020-04-11 – 2020-04-12 (×3): 15 mg via ORAL
  Filled 2020-04-11 (×3): qty 1

## 2020-04-11 NOTE — Plan of Care (Signed)
  Problem: Education: Goal: Knowledge of General Education information will improve Description Including pain rating scale, medication(s)/side effects and non-pharmacologic comfort measures Outcome: Progressing   

## 2020-04-11 NOTE — Progress Notes (Signed)
ANTICOAGULATION CONSULT NOTE - Follow Up Consult  Pharmacy Consult for Heparin Indication: pulmonary embolus  No Known Allergies  Patient Measurements: Height: 5\' 2"  (157.5 cm) Weight: 58.2 kg (128 lb 4.9 oz) IBW/kg (Calculated) : 50.1 Heparin Dosing Weight: actual weight  Vital Signs: Temp: 99.4 F (37.4 C) (05/25 0459) Temp Source: Oral (05/25 0459) BP: 113/67 (05/25 0459) Pulse Rate: 75 (05/25 0459)  Labs: Recent Labs    04/10/20 0129 04/10/20 0455 04/10/20 04/12/20 04/10/20 04/12/20 04/10/20 1234 04/10/20 1822 04/11/20 0433  HGB 12.6  --   --   --   --   --  13.2  HCT 39.4  --   --   --   --   --  41.8  PLT 224  --   --   --   --   --  227  APTT  --  31  --   --   --   --   --   LABPROT  --  14.6  --   --   --   --   --   INR  --  1.2  --   --   --   --   --   HEPARINUNFRC  --   --  0.74*   < > 0.63 0.58 0.80*  CREATININE 0.74  --   --   --   --   --   --   TROPONINIHS  --   --  <2  --   --   --   --    < > = values in this interval not displayed.    Estimated Creatinine Clearance: 88 mL/min (by C-G formula based on SCr of 0.74 mg/dL).   Medications:  Scheduled:  . sodium chloride flush  3 mL Intravenous Q12H  . sodium chloride flush  3 mL Intravenous Q12H   Infusions:  . sodium chloride    . heparin 1,150 Units/hr (04/10/20 2352)   PRN: sodium chloride, acetaminophen **OR** acetaminophen, bisacodyl, HYDROcodone-acetaminophen, morphine injection, ondansetron **OR** ondansetron (ZOFRAN) IV, senna-docusate, sodium chloride flush  Assessment: 19 yo female admitted with +PE in RLL segmental artery with infarction.  Pharmacy is consulted to dose IV heparin.  Today, 04/11/2020  Heparin level 0.8 is supra-therapeutic on 1150 units/hr  No bleeding per RN  CBC stable  Goal of Therapy:  Heparin level 0.3-0.7 units/ml Monitor platelets by anticoagulation protocol: Yes   Plan:   Decrease heparin infusion to 1100 units/hr  Recheck HL in 6 hours  Daily  heparin level and CBC  Monitor for signs/symptoms of bleeding  F/u transition to oral anticoagulant  04/13/2020, PharmD, BCPS 04/11/2020,5:37 AM

## 2020-04-11 NOTE — Progress Notes (Signed)
Pt received dose of Toradol 30 mg earlier in shift, pt has been resting throughout the day without additional pain meds. Will cont to monitor. SRP, RN

## 2020-04-11 NOTE — Plan of Care (Signed)
  Problem: Education: Goal: Knowledge of General Education information will improve Description: Including pain rating scale, medication(s)/side effects and non-pharmacologic comfort measures Outcome: Progressing   Problem: Health Behavior/Discharge Planning: Goal: Ability to manage health-related needs will improve Outcome: Progressing   Problem: Clinical Measurements: Goal: Ability to maintain clinical measurements within normal limits will improve Outcome: Progressing Goal: Will remain free from infection Outcome: Progressing Goal: Diagnostic test results will improve Outcome: Progressing Goal: Respiratory complications will improve Outcome: Progressing   Problem: Activity: Goal: Risk for activity intolerance will decrease Outcome: Progressing   Problem: Nutrition: Goal: Adequate nutrition will be maintained Outcome: Progressing   Problem: Coping: Goal: Level of anxiety will decrease Outcome: Progressing   Problem: Pain Managment: Goal: General experience of comfort will improve Outcome: Progressing   

## 2020-04-11 NOTE — Progress Notes (Addendum)
PROGRESS NOTE    Briana Villegas  AYT:[id number] DOB: 2001/07/17 DOA: 04/10/2020 PCP: System, Pcp Not In   Chef Complaints: chest pain  Brief Narrative: 19 year old female no significant past medical history admitted with pleuritic chest pain and in the ED found to have PE involving the right lower segmental artery with suspected infarct COVID-19 was negative.  Reports she has been on on Nuvaring (etonogestrel/ethinyl estradiol) for birth control q 4 wk- stays for 21 days and off for 1 wk and has been on that since 2016  Subjective: Complains of severe pleuritic chest pain on the right side unrelieved by her Norco. Not needing oxygen but difficulty with deep breath.   Assessment & Plan:  Acute PE right lower lobe: CTA showed filling defect in the posterior basal segmental pulmonary artery of the right lower lobe with extension into the subsegmental branches.Opacity in the peripheral base segment of the right lower lobe.  No significant risk factors besides being on Nuvaring (etonogestrel/ethinyl estradiol) vaginal ring since 2016.  Ring has been removed after she discussed with her OB/GYN.  Cont heparin drip, if pain is stabilized will transition to DOAC later today or in am.  Advised follow-up with hematology for hypercoagulable work-up.  Pulmonary infarct due to #1 having ongoing active chest pain, added IV Toradol, continue p.o. Norco  Leukocytosis: Is improving.  DVT prophylaxis:Heparin Code Status: full  Family Communication: plan of care discussed with patient at bedside.  I had updated her mother on the phone was on listening to the conversation yesterday  Status is: Admitted under observation Patient continues to require hospitalization due to ongoing pleuritic chest pain in the setting of PE with pulmonary infarct with IV pain medication uncontrolled by p.o. we will optimize her pain regimen slowly transition to po.  Dispo: The patient is from: Home              Anticipated  d/c is to: Home              Anticipated d/c date is: 1 day              Patient currently is not medically stable to d/c.   Diet Order            Diet regular Room service appropriate? Yes; Fluid consistency: Thin  Diet effective now             Body mass index is 23.47 kg/m. Consultants:see note  Procedures:see note Microbiology:see note  Medications: Scheduled Meds: . sodium chloride flush  3 mL Intravenous Q12H  . sodium chloride flush  3 mL Intravenous Q12H   Continuous Infusions: . sodium chloride    . heparin 1,100 Units/hr (04/11/20 3557)    Antimicrobials: Anti-infectives (From admission, onward)   None       Objective: Vitals: Today's Vitals   04/11/20 0857 04/11/20 0957 04/11/20 1130 04/11/20 1238  BP:    117/82  Pulse:    79  Resp:    16  Temp:    98 F (36.7 C)  TempSrc:    Oral  SpO2:    100%  Weight:      Height:      PainSc: 10-Worst pain ever 5  3     No intake or output data in the 24 hours ending 04/11/20 1506 Filed Weights   04/10/20 0003 04/10/20 0628  Weight: 59.4 kg 58.2 kg   Weight change:    Intake/Output from previous day: No intake/output data recorded. Intake/Output this  shift: No intake/output data recorded.  Examination:  General exam: AAOx3 ,NAD, weak appearing. HEENT:Oral mucosa moist, Ear/Nose WNL grossly,dentition normal. Respiratory system: bilaterally diminished breath sounds, clear, no wheezing or crackles,no use of accessory muscle, non tender. Cardiovascular system: S1 & S2 +, regular, No JVD. Gastrointestinal system: Abdomen soft, NT,ND, BS+. Nervous System:Alert, awake, moving extremities and grossly nonfocal Extremities: No edema, distal peripheral pulses palpable.  Skin: No rashes,no icterus. MSK: Normal muscle bulk,tone, power  Data Reviewed: I have personally reviewed following labs and imaging studies CBC: Recent Labs  Lab 04/10/20 0129 04/11/20 0433  WBC 11.9* 10.9*  NEUTROABS 7.1  --     HGB 12.6 13.2  HCT 39.4 41.8  MCV 87.0 86.5  PLT 224 227   Basic Metabolic Panel: Recent Labs  Lab 04/10/20 0129  NA 140  K 3.8  CL 106  CO2 22  GLUCOSE 100*  BUN 11  CREATININE 0.74  CALCIUM 9.3   GFR: Estimated Creatinine Clearance: 88 mL/min (by C-G formula based on SCr of 0.74 mg/dL). Liver Function Tests: Recent Labs  Lab 04/10/20 0129  AST 42*  ALT 71*  ALKPHOS 76  BILITOT 0.8  PROT 7.6  ALBUMIN 3.9   Recent Labs  Lab 04/10/20 0129  LIPASE 25   No results for input(s): AMMONIA in the last 168 hours. Coagulation Profile: Recent Labs  Lab 04/10/20 0455  INR 1.2   Cardiac Enzymes: No results for input(s): CKTOTAL, CKMB, CKMBINDEX, TROPONINI in the last 168 hours. BNP (last 3 results) No results for input(s): PROBNP in the last 8760 hours. HbA1C: No results for input(s): HGBA1C in the last 72 hours. CBG: No results for input(s): GLUCAP in the last 168 hours. Lipid Profile: No results for input(s): CHOL, HDL, LDLCALC, TRIG, CHOLHDL, LDLDIRECT in the last 72 hours. Thyroid Function Tests: No results for input(s): TSH, T4TOTAL, FREET4, T3FREE, THYROIDAB in the last 72 hours. Anemia Panel: No results for input(s): VITAMINB12, FOLATE, FERRITIN, TIBC, IRON, RETICCTPCT in the last 72 hours. Sepsis Labs: No results for input(s): PROCALCITON, LATICACIDVEN in the last 168 hours.  Recent Results (from the past 240 hour(s))  SARS Coronavirus 2 by RT PCR (hospital order, performed in [facility] hospital lab) Nasopharyngeal Nasopharyngeal Swab     Status: None   Collection Time: 04/10/20  2:39 AM   Specimen: Nasopharyngeal Swab  Result Value Ref Range Status   SARS Coronavirus 2 NEGATIVE NEGATIVE Final    Comment: (NOTE) SARS-CoV-2 target nucleic acids are NOT DETECTED. The SARS-CoV-2 RNA is generally detectable in upper and lower respiratory specimens during the acute phase of infection. The lowest concentration of SARS-CoV-2 viral copies this assay can  detect is 250 copies / mL. A negative result does not preclude SARS-CoV-2 infection and should not be used as the sole basis for treatment or other patient management decisions.  A negative result may occur with improper specimen collection / handling, submission of specimen other than nasopharyngeal swab, presence of viral mutation(s) within the areas targeted by this assay, and inadequate number of viral copies (<250 copies / mL). A negative result must be combined with clinical observations, patient history, and epidemiological information. Fact Sheet for Patients:   BoilerBrush.com.cy Fact Sheet for Healthcare Providers: https://pope.com/ This test is not yet approved or cleared  by the [place] FDA and has been authorized for detection and/or diagnosis of SARS-CoV-2 by FDA under an Emergency Use Authorization (EUA).  This EUA will remain in effect (meaning this test can be used) for  the duration of the COVID-19 declaration under Section 564(b)(1) of the Act, 21 U.S.C. section 360bbb-3(b)(1), unless the authorization is terminated or revoked sooner. Performed at [facility], [place] [address], [place], [place] [address]       Radiology Studies: DG Chest 2 View  Result Date: 04/10/2020 CLINICAL DATA:  Pleuritic chest pain. EXAM: CHEST - 2 VIEW COMPARISON:  None. FINDINGS: The heart size and mediastinal contours are within normal limits. Both lungs are clear. The visualized skeletal structures are unremarkable. IMPRESSION: No active cardiopulmonary disease. Electronically Signed   By: Constance Holster M.D.   On: 04/10/2020 02:07   CT Angio Chest PE W and/or Wo Contrast  Result Date: 04/10/2020 CLINICAL DATA:  Positive D-dimer, right pleuritic pain, elevated liver enzymes EXAM: CT ANGIOGRAPHY CHEST WITH CONTRAST TECHNIQUE: Multidetector CT imaging of the chest was performed using the standard protocol during  bolus administration of intravenous contrast. Multiplanar CT image reconstructions and MIPs were obtained to evaluate the vascular anatomy. CONTRAST:  172mL OMNIPAQUE IOHEXOL 350 MG/ML SOLN COMPARISON:  Chest radiograph 04/10/2020 FINDINGS: Cardiovascular: Satisfactory opacification the pulmonary arteries. Filling defect in the posterior basal segmental pulmonary artery of the right lower lobe with extension into the subsegmental branches. No proximal propagation or other significant filling defects are seen within the right or left lung. Central pulmonary arteries are normal caliber. Normal heart size. No pericardial effusion. The aorta is normal caliber. Normal 3 vessel branching of the aortic arch. Normal heart size. No pericardial effusion. Mediastinum/Nodes: No mediastinal fluid or gas. Normal thyroid gland and thoracic inlet. No acute abnormality of the trachea or esophagus. No worrisome mediastinal, hilar or axillary adenopathy. Lungs/Pleura: Wedge-shaped region of opacity in the peripheral basal segment of the right lower lobe, corresponding well to the vascular distribution of the pulmonary artery filling defect as detailed above, is most compatible with a pulmonary infarct. Lungs are otherwise clear. No pneumothorax or effusion. No concerning pulmonary nodules or masses. Upper Abdomen: No acute abnormalities present in the visualized portions of the upper abdomen. Musculoskeletal: No acute osseous abnormality or suspicious osseous lesion. Review of the MIP images confirms the above findings. IMPRESSION: 1. Acute pulmonary artery filling defect in the posterior basal segmental pulmonary artery of the right lower lobe with extension into the subsegmental branches. 2. Wedge-shaped region of opacity in the peripheral basal segment of the right lower lobe corresponding well to the vascular distribution of the pulmonary artery filling defect most compatible with a pulmonary infarct. Critical Value/emergent  results were called by telephone at the time of interpretation on 04/10/2020 at 3:42 am to provider [facility] , who verbally acknowledged these results. Electronically Signed   By: Lovena Le M.D.   On: 04/10/2020 03:43     LOS: 0 days   Antonieta Pert, MD [facility]  04/11/2020, 3:06 PM

## 2020-04-11 NOTE — Progress Notes (Signed)
ANTICOAGULATION CONSULT NOTE - Follow Up Consult  Pharmacy Consult for Heparin Indication: pulmonary embolus  No Known Allergies  Patient Measurements: Height: 5\' 2"  (157.5 cm) Weight: 58.2 kg (128 lb 4.9 oz) IBW/kg (Calculated) : 50.1 Heparin Dosing Weight: actual weight  Vital Signs: Temp: 98 F (36.7 C) (05/25 1238) Temp Source: Oral (05/25 1238) BP: 117/82 (05/25 1238) Pulse Rate: 79 (05/25 1238)  Labs: Recent Labs    04/10/20 0129 04/10/20 0455 04/10/20 04/12/20 04/10/20 1234 04/10/20 1822 04/11/20 0433 04/11/20 1216  HGB 12.6  --   --   --   --  13.2  --   HCT 39.4  --   --   --   --  41.8  --   PLT 224  --   --   --   --  227  --   APTT  --  31  --   --   --   --   --   LABPROT  --  14.6  --   --   --   --   --   INR  --  1.2  --   --   --   --   --   HEPARINUNFRC  --   --  0.74*   < > 0.58 0.80* 0.70  CREATININE 0.74  --   --   --   --   --   --   TROPONINIHS  --   --  <2  --   --   --   --    < > = values in this interval not displayed.    Estimated Creatinine Clearance: 88 mL/min (by C-G formula based on SCr of 0.74 mg/dL).   Medications:  Scheduled:  . sodium chloride flush  3 mL Intravenous Q12H  . sodium chloride flush  3 mL Intravenous Q12H   Infusions:  . sodium chloride    . heparin 1,100 Units/hr (04/11/20 0620)   PRN: sodium chloride, acetaminophen **OR** acetaminophen, bisacodyl, HYDROcodone-acetaminophen, ketorolac, morphine injection, ondansetron **OR** ondansetron (ZOFRAN) IV, senna-docusate, sodium chloride flush  Assessment: 19 yo female admitted with +PE in RLL segmental artery with infarction.  Pharmacy is consulted to dose IV heparin.  Today, 04/11/2020  Heparin level 0.7 is at upper-end of therapeutic range on 1100 units/hr  No bleeding per RN  CBC stable  Goal of Therapy:  Heparin level 0.3-0.7 units/ml Monitor platelets by anticoagulation protocol: Yes   Plan:   Continue heparin infusion at 1100 units/hr  Recheck HL  in 6 hours  Daily heparin level and CBC  Monitor for signs/symptoms of bleeding  F/u transition to oral anticoagulant  04/13/2020, PharmD, BCPS Pharmacy: [phone] 04/11/2020,1:18 PM

## 2020-04-12 ENCOUNTER — Inpatient Hospital Stay (HOSPITAL_COMMUNITY): Payer: No Typology Code available for payment source

## 2020-04-12 MED ORDER — RIVAROXABAN (XARELTO) VTE STARTER PACK (15 & 20 MG)
ORAL_TABLET | ORAL | 0 refills | Status: AC
Start: 2020-04-12 — End: ?

## 2020-04-12 MED ORDER — TRAMADOL HCL 50 MG PO TABS: 50.0000 mg | ORAL_TABLET | Freq: Four times a day (QID) | ORAL | 0 refills | Status: AC | PRN

## 2020-04-12 MED ORDER — TRAMADOL HCL 50 MG PO TABS: 50.0000 mg | ORAL_TABLET | Freq: Four times a day (QID) | ORAL | Status: DC | PRN

## 2020-04-12 MED FILL — XARELTO STARTER PACK: 15 & 20 | 30 days supply | Qty: 51 | Fill #0

## 2020-04-12 MED FILL — traMADol HCL 50 MG TABS: 50 | 5 days supply | Qty: 20 | Fill #0

## 2020-04-12 NOTE — Discharge Summary (Signed)
Physician Discharge Summary  Briana Villegas WJX:[id number] DOB: 2001-10-15 DOA: 04/10/2020  PCP: System, Pcp Not In  Admit date: 04/10/2020 Discharge date: 04/12/2020  Admitted From: home Disposition:  Home  Recommendations for Outpatient Follow-up:  1. Follow up with PCP in 1 weeks AND WITH ONCOLOGY- Dr Dion Body- no provided in 1-2 weeks.  Home Health:No  Equipment/Devices: None  Discharge Condition: Stable Code Status: FULL Diet recommendation:  Diet Order            Diet general        Diet regular Room service appropriate? Yes; Fluid consistency: Thin  Diet effective now              Brief/Interim Summary: 19 year old female no significant past medical history admitted with pleuritic chest pain and in the ED found to have PE involving the right lower segmental artery with suspected infarct COVID-19 was negative.Reports she has been on on Nuvaring (etonogestrel/ethinyl estradiol) for birth control q 4 wk- stays for 21 daysand off for 1 wk.And has been on that since 2016 Patient was treated with IV heparin infusion and subsequently transitioned to Xarelto. She had a CT chest pain improved with Toradol/opiates. At this time patient feels much improved not needing oxygen moderate tachycardic on ambulation as expected and low-grade fever with PE. She feels well and okay for discharge home today. Discussed with Dr. Homero Fellers from hematology oncology advised follow-up with hematology as outpatient hold off on hypercoagulable work-up.  I have provided Dr. Kem Kays phone number and address in [place] near patient's home town for a follow-up. I have instructed patient regarding her diagnosis, prognosis possible complication also to the mother.  Watch out for any kind of bleeding and if severe uncontrolled chest pain shortness of breath hemoptysis needs immediate ED evaluation. At this time she remains medically stable for discharge.  Discharge Diagnoses:   Acute PE right lower lobe: CTA  showed filling defect in the posterior basal segmental pulmonary artery of the right lower lobe with extension into the subsegmental branches with weight shift.  Opacity in the peripheral base segment of the right lower lobe.  No significant risk factor except being on Nuvaring (etonogestrel/ethinyl estradiol) vaginal ring q 4 wks since 2016.  Ring has been removed after she discussed with her OB/GYN.  Tolerating Xarelto, pain is a stable we will discharge her on Tylenol/tramadol for pain control.  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  Explained about possible complications and to seek immediate medical attention if any   Leukocytosis: Improved to 10.9. Low-grade fever likely from her PE.  No cough.  Chest x-ray pulmonary infarct seen as shown in the CT scan  Consults:  Dr Mosetta Putt- oncology  Subjective: Chest pain improved. ambulated well no hypoxia. Hr up but expected.  low grade fever early  But afebrile now  Discharge Exam: Vitals:   04/12/20 0522 04/12/20 1417  BP: 122/69 119/68  Pulse: 80 (!) 105  Resp: 16 14  Temp: (!) 100.6 F (38.1 C) 98.6 F (37 C)  SpO2: 100% 100%   General: Pt is alert, awake, not in acute distress Cardiovascular: RRR, S1/S2 +, no rubs, no gallops Respiratory: CTA bilaterally, no wheezing, no rhonchi Abdominal: Soft, NT, ND, bowel sounds + Extremities: no edema, no cyanosis  Discharge Instructions  Discharge Instructions    Diet  general   Complete by: As directed    Discharge instructions   Complete by: As directed    He will need to follow-up with the hematology clinic for hypercoagulable work-up.  Please follow-up with PCP regarding return to work.  Please call call MD or return to ER for similar or worsening recurring problem  that brought you to hospital or if any fever,nausea/vomiting,abdominal pain, uncontrolled pain, chest pain,  shortness of breath or any other alarming symptoms.  Please follow-up your doctor as instructed in a week time and call the office for appointment.  Please avoid alcohol, smoking, or any other illicit substance and maintain healthy habits including taking your regular medications as prescribed.  You were cared for by a hospitalist during your hospital stay. If you have any questions about your discharge medications or the care you received while you were in the hospital after you are discharged, you can call the unit and ask to speak with the hospitalist on call if the hospitalist that took care of you is not available.  Once you are discharged, your primary care physician will handle any further medical issues. Please note that NO REFILLS for any discharge medications will be authorized once you are discharged, as it is imperative that you return to your primary care physician (or establish a relationship with a primary care physician if you do not have one) for your aftercare needs so that they can reassess your need for medications and monitor your lab values   Increase activity slowly   Complete by: As directed    Avoid strenuous physical activity and increase activity as tolerated.     Allergies as of 04/12/2020   No Known Allergies     Medication List    STOP taking these medications   etonogestrel-ethinyl estradiol 0.12-0.015 MG/24HR vaginal ring Commonly known as: NUVARING     TAKE these medications   ibuprofen 200 MG tablet Commonly known as: ADVIL Take 400 mg by mouth every 6 (six) hours as needed for fever, headache, mild pain, moderate pain or cramping.   Rivaroxaban Stater Pack (15 mg and 20 mg) Commonly known as: XARELTO STARTER PACK Follow package directions: Take one 15mg  tablet by mouth twice a day. On day 22, switch to one 20mg  tablet once a day. Take with  food.   traMADol 50 MG tablet Commonly known as: ULTRAM Take 1 tablet (50 mg total) by mouth every 6 (six) hours as needed for up to 20 doses for moderate pain.      Follow-up Information     [facility] Follow up.   Why: you have an appointment on June 16 at 11 am to establish primary care. Contact information: [address] [place] [phone]       Dr Dion BodyZhao. Call in 2 week(s).   Why: [phone] Referred by Dr Mosetta PuttFeng. Contact information: [address], [address] [place], [place] [id number]           No Known Allergies  The results of significant diagnostics from this hospitalization (including imaging, microbiology, ancillary and laboratory) are listed below for reference.    Microbiology: Recent Results (from the past 240 hour(s))  SARS Coronavirus 2 by RT PCR (hospital order, [facility]) Nasopharyngeal Nasopharyngeal Swab     Status: None   Collection Time: 04/10/20  2:39 AM   Specimen: Nasopharyngeal Swab  Result Value Ref Range Status   SARS Coronavirus 2 NEGATIVE  NEGATIVE Final    Comment: (NOTE) SARS-CoV-2 target nucleic acids are NOT DETECTED. The SARS-CoV-2 RNA is generally detectable in upper and lower respiratory specimens during the acute phase of infection. The lowest concentration of SARS-CoV-2 viral copies this assay can detect is 250 copies / mL. A negative result does not preclude SARS-CoV-2 infection and should not be used as the sole basis for treatment or other patient management decisions.  A negative result may occur with improper specimen collection / handling, submission of specimen other than nasopharyngeal swab, presence of viral mutation(s) within the areas targeted by this assay, and inadequate number of viral copies (<250 copies / mL). A negative result must be combined with clinical observations, patient history, and epidemiological  information. Fact Sheet for Patients:   BoilerBrush.com.cy Fact Sheet for Healthcare Providers: https://pope.com/ This test is not yet approved or cleared  by the [place] FDA and has been authorized for detection and/or diagnosis of SARS-CoV-2 by FDA under an Emergency Use Authorization (EUA).  This EUA will remain in effect (meaning this test can be used) for the duration of the COVID-19 declaration under Section 564(b)(1) of the Act, 21 U.S.C. section 360bbb-3(b)(1), unless the authorization is terminated or revoked sooner. Performed at [facility], [address], [place], [place] [address]     Procedures/Studies: DG Chest 2 View  Result Date: 04/12/2020 CLINICAL DATA:  Pulmonary emboli EXAM: CHEST - 2 VIEW COMPARISON:  04/10/2020 FINDINGS: Increased density at the right lung base posteriorly likely corresponding to infarct seen on CT. No pleural effusion or pneumothorax. Stable cardiomediastinal contours with heart size. IMPRESSION: Increase posterior right lower lobe density corresponding to probable infarct on CT. Electronically Signed   By: Guadlupe Spanish M.D.   On: 04/12/2020 09:06   DG Chest 2 View  Result Date: 04/10/2020 CLINICAL DATA:  Pleuritic chest pain. EXAM: CHEST - 2 VIEW COMPARISON:  None. FINDINGS: The heart size and mediastinal contours are within normal limits. Both lungs are clear. The visualized skeletal structures are unremarkable. IMPRESSION: No active cardiopulmonary disease. Electronically Signed   By: Katherine Mantle M.D.   On: 04/10/2020 02:07   CT Angio Chest PE W and/or Wo Contrast  Result Date: 04/10/2020 CLINICAL DATA:  Positive D-dimer, right pleuritic pain, elevated liver enzymes EXAM: CT ANGIOGRAPHY CHEST WITH CONTRAST TECHNIQUE: Multidetector CT imaging of the chest was performed using the standard protocol during bolus administration of intravenous contrast. Multiplanar CT  image reconstructions and MIPs were obtained to evaluate the vascular anatomy. CONTRAST:  OMNIPAQUE IOHEXOL 350 MG/ML SOLN COMPARISON:  Chest radiograph 04/10/2020 FINDINGS: Cardiovascular: Satisfactory opacification the pulmonary arteries. Filling defect in the posterior basal segmental pulmonary artery of the right lower lobe with extension into the subsegmental branches. No proximal propagation or other significant filling defects are seen within the right or left lung. Central pulmonary arteries are normal caliber. Normal heart size. No pericardial effusion. The aorta is normal caliber. Normal 3 vessel branching of the aortic arch. Normal heart size. No pericardial effusion. Mediastinum/Nodes: No mediastinal fluid or gas. Normal thyroid gland and thoracic inlet. No acute abnormality of the trachea or esophagus. No worrisome mediastinal, hilar or axillary adenopathy. Lungs/Pleura: Wedge-shaped region of opacity in the peripheral basal segment of the right lower lobe, corresponding well to the vascular distribution of the pulmonary artery filling defect as detailed above, is most compatible with a pulmonary infarct. Lungs are otherwise clear. No pneumothorax or effusion. No concerning pulmonary nodules or masses. Upper Abdomen: No acute  abnormalities present in the visualized portions of the upper abdomen. Musculoskeletal: No acute osseous abnormality or suspicious osseous lesion. Review of the MIP images confirms the above findings. IMPRESSION: 1. Acute pulmonary artery filling defect in the posterior basal segmental pulmonary artery of the right lower lobe with extension into the subsegmental branches. 2. Wedge-shaped region of opacity in the peripheral basal segment of the right lower lobe corresponding well to the vascular distribution of the pulmonary artery filling defect most compatible with a pulmonary infarct. Critical Value/emergent results were called by telephone at the time of interpretation on  04/10/2020 at 3:42 am to provider [facility] , who verbally acknowledged these results. Electronically Signed   By: Lovena Le M.D.   On: 04/10/2020 03:43    Labs: BNP (last 3 results) Recent Labs    04/10/20 0638  BNP 60.4   Basic Metabolic Panel: Recent Labs  Lab 04/10/20 0129  NA 140  K 3.8  CL 106  CO2 22  GLUCOSE 100*  BUN 11  CREATININE 0.74  CALCIUM 9.3   Liver Function Tests: Recent Labs  Lab 04/10/20 0129  AST 42*  ALT 71*  ALKPHOS 76  BILITOT 0.8  PROT 7.6  ALBUMIN 3.9   Recent Labs  Lab 04/10/20 0129  LIPASE 25   No results for input(s): AMMONIA in the last 168 hours. CBC: Recent Labs  Lab 04/10/20 0129 04/11/20 0433  WBC 11.9* 10.9*  NEUTROABS 7.1  --   HGB 12.6 13.2  HCT 39.4 41.8  MCV 87.0 86.5  PLT 224 227   Cardiac Enzymes: No results for input(s): CKTOTAL, CKMB, CKMBINDEX, TROPONINI in the last 168 hours. BNP: Invalid input(s): POCBNP CBG: No results for input(s): GLUCAP in the last 168 hours. D-Dimer Recent Labs    04/10/20 0129  DDIMER 1.31*   Hgb A1c No results for input(s): HGBA1C in the last 72 hours. Lipid Profile No results for input(s): CHOL, HDL, LDLCALC, TRIG, CHOLHDL, LDLDIRECT in the last 72 hours. Thyroid function studies No results for input(s): TSH, T4TOTAL, T3FREE, THYROIDAB in the last 72 hours.  Invalid input(s): FREET3 Anemia work up No results for input(s): VITAMINB12, FOLATE, FERRITIN, TIBC, IRON, RETICCTPCT in the last 72 hours. Urinalysis No results found for: COLORURINE, APPEARANCEUR, [place], [place], [place], [place], [place], [place], PROTEINUR, UROBILINOGEN, NITRITE, LEUKOCYTESUR Sepsis Labs Invalid input(s): PROCALCITONIN,  WBC,  LACTICIDVEN Microbiology Recent Results (from the past 240 hour(s))  SARS Coronavirus 2 by RT PCR (hospital order, performed in [facility] hospital lab) Nasopharyngeal Nasopharyngeal Swab     Status: None   Collection Time: 04/10/20  2:39 AM    Specimen: Nasopharyngeal Swab  Result Value Ref Range Status   SARS Coronavirus 2 NEGATIVE NEGATIVE Final    Comment: (NOTE) SARS-CoV-2 target nucleic acids are NOT DETECTED. The SARS-CoV-2 RNA is generally detectable in upper and lower respiratory specimens during the acute phase of infection. The lowest concentration of SARS-CoV-2 viral copies this assay can detect is 250 copies / mL. A negative result does not preclude SARS-CoV-2 infection and should not be used as the sole basis for treatment or other patient management decisions.  A negative result may occur with improper specimen collection / handling, submission of specimen other than nasopharyngeal swab, presence of viral mutation(s) within the areas targeted by this assay, and inadequate number of viral copies (<250 copies / mL). A negative result must be combined with clinical observations, patient history, and epidemiological information. Fact Sheet for Patients:   StrictlyIdeas.no Fact Sheet for  Healthcare Providers: https://pope.com/ This test is not yet approved or cleared  by the [place] and has been authorized for detection and/or diagnosis of SARS-CoV-2 by FDA under an Emergency Use Authorization (EUA).  This EUA will remain in effect (meaning this test can be used) for the duration of the COVID-19 declaration under Section 564(b)(1) of the Act, 21 U.S.C. section 360bbb-3(b)(1), unless the authorization is terminated or revoked sooner. Performed at [facility], [address] [place], [place] [address]      Time coordinating discharge: 35  minutes  SIGNED: Lanae Boast, MD  [facility] 04/12/2020, 3:57 PM  If 7PM-7AM, please contact night-coverage [email or web address]

## 2020-04-12 NOTE — Discharge Instructions (Signed)

## 2020-04-12 NOTE — Plan of Care (Signed)
  Problem: Education: Goal: Knowledge of General Education information will improve Description Including pain rating scale, medication(s)/side effects and non-pharmacologic comfort measures Outcome: Progressing   

## 2020-04-12 NOTE — Progress Notes (Signed)
Pt ambulated around the unit, off oxygen, sats remain 95-98% while ambulating. Heart rate elevated slight to 110-120 during ambulation. SRP, RN

## 2020-04-12 NOTE — Progress Notes (Signed)
Pt discharge to home instruction reviewed with pt.Acknowledged understanding. SRP,RN

## 2020-04-12 NOTE — Progress Notes (Signed)
MD updated of oxygen status. SP, RN

## 2020-05-03 ENCOUNTER — Inpatient Hospital Stay: Payer: No Typology Code available for payment source | Admitting: Critical Care Medicine

## 2020-05-03 NOTE — Progress Notes (Deleted)
   Subjective:    Patient ID: Briana Villegas, female    DOB: Jan 29, 2001, 19 y.o.   MRN: [id number]  19 y.o.F hosp f/u   Admit date: 04/10/2020 Discharge date: 04/12/2020  Admitted From: home Disposition:  Home  Recommendations for Outpatient Follow-up:  1. Follow up with PCP in 1 weeks AND WITH ONCOLOGY- Dr Dion Body- no provided in 1-2 weeks.  Home Health:No  Equipment/Devices: None  Discharge Condition: Stable Code Status: FULL Diet recommendation:  Diet Order             Diet general        Diet regular Room service appropriate? Yes; Fluid consistency: Thin  Diet effective now          Brief/Interim Summary: 19 year old female no significant past medical history admitted with pleuritic chest pain and in the ED found to have PE involving the right lower segmental artery with suspected infarct COVID-19 was negative.Reports she has been on on Nuvaring (etonogestrel/ethinyl estradiol) for birth control q 4 wk- stays for 21 daysand off for 1 wk.And has been on that since 2016 Patient was treated with IV heparin infusion and subsequently transitioned to Xarelto. She had a CT chest pain improved with Toradol/opiates. At this time patient feels much improved not needing oxygen moderate tachycardic on ambulation as expected and low-grade fever with PE. She feels well and okay for discharge home today. Discussed with Dr. Homero Fellers from hematology oncology advised follow-up with hematology as outpatient hold off on hypercoagulable work-up.  I have provided Dr. Kem Kays phone number and address in [place] near patient's home town for a follow-up. I have instructed patient regarding her diagnosis, prognosis possible complication also to the mother.  Watch out for any kind of bleeding and if severe uncontrolled chest pain shortness of breath hemoptysis needs immediate ED evaluation. At this time she remains medically stable for discharge.  Discharge Diagnoses:   Acute PE  right lower lobe:CTA showed filling defect in the posterior basal segmental pulmonary artery of the right lower lobe with extension into the subsegmental branches with weight shift. Opacity in the peripheral base segment of the right lower lobe.No significant risk factor except being onNuvaring (etonogestrel/ethinyl estradiol)vaginal ring q4 wkssince 2016.Ring has been removed after she discussed with her OB/GYN.  Tolerating Xarelto, pain is a stable we will discharge her on Tylenol/tramadol for pain control.  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  Explained about possible complications and to seek immediate medical attention if any   Leukocytosis: Improved to 10.9. Low-grade fever likely from her PE.  No cough.  Chest x-ray pulmonary infarct seen as shown in the CT scan  Consults:  Dr Mosetta Putt- oncology      Review of Systems     Objective:   Physical Exam        Assessment & Plan:

## 2021-01-05 IMAGING — CT CT ANGIO CHEST
1 of 12 series · 13 of 38 positions shown · IV contrast (omnipaque)
Comparison: Chest radiograph 04/10/2020

CLINICAL DATA: Positive D-dimer, right pleuritic pain, elevated
liver enzymes

EXAM:
CT ANGIOGRAPHY CHEST WITH CONTRAST
TECHNIQUE: Multidetector CT imaging of the chest was performed using the
standard protocol during bolus administration of intravenous
contrast. Multiplanar CT image reconstructions and MIPs were
obtained to evaluate the vascular anatomy.
CONTRAST:  100mL OMNIPAQUE IOHEXOL 350 MG/ML SOLN

[Series 5: thins · axial · 0.70mm/px · z∈[-242,+10]mm · 13 of 295 slices shown]
[im 22/295  lung]
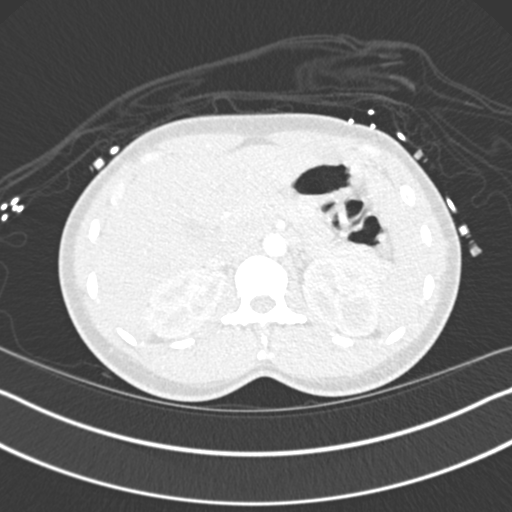
[im 43/295  mediastinal]
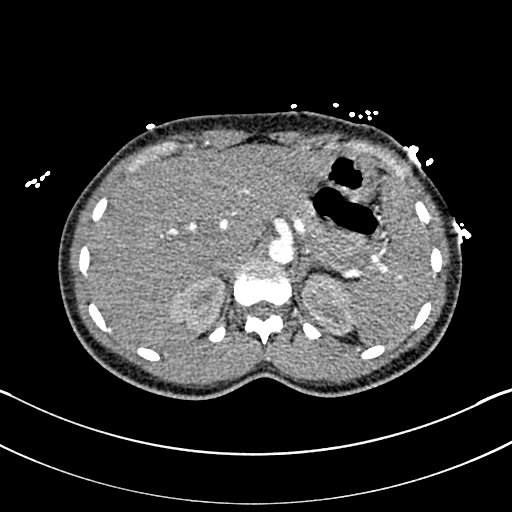
[im 64/295  lung]
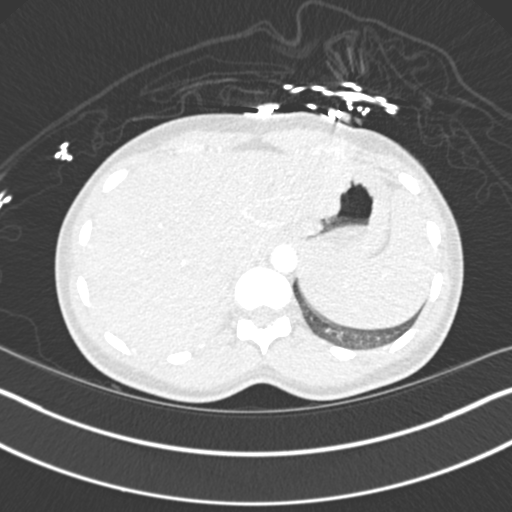
[im 85/295  mediastinal]
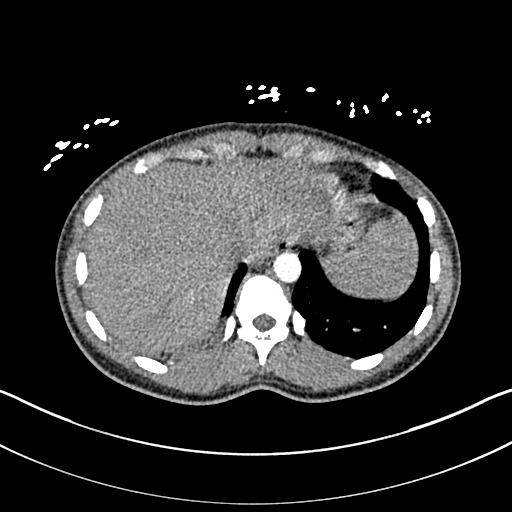
[im 106/295  lung]
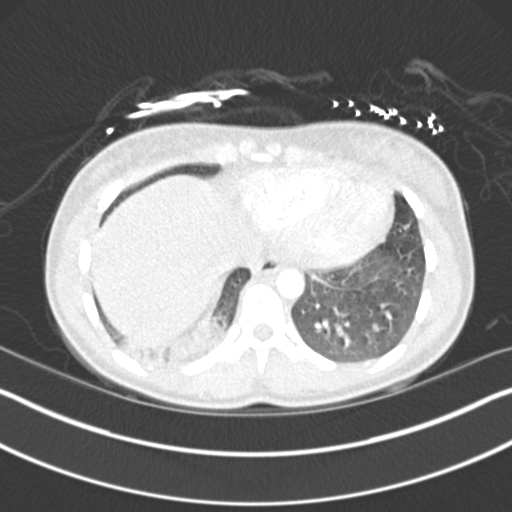
[im 127/295  mediastinal]
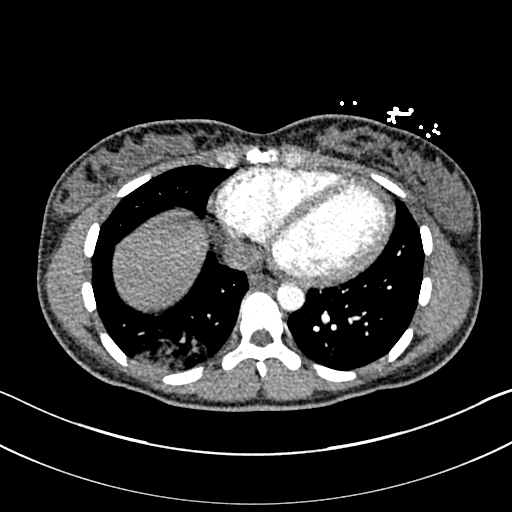
[im 148/295  lung]
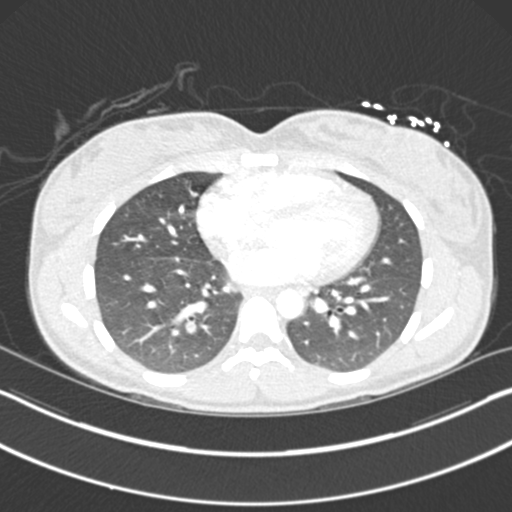
[im 169/295  mediastinal]
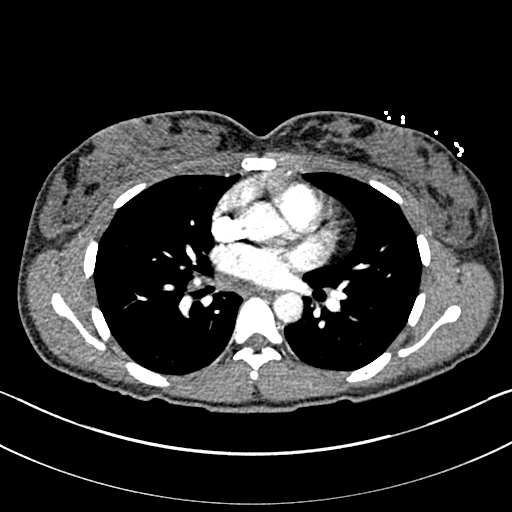
[im 190/295  lung]
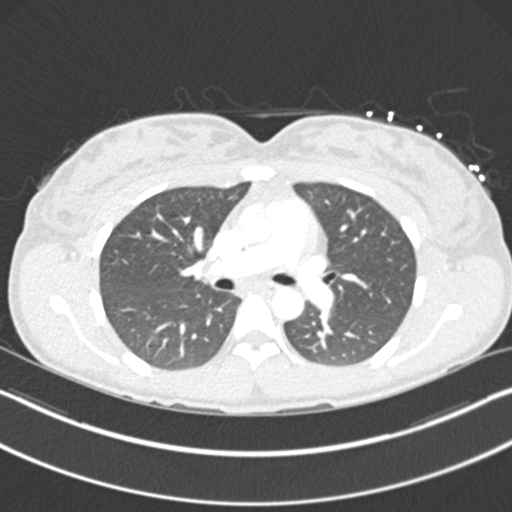
[im 211/295  mediastinal]
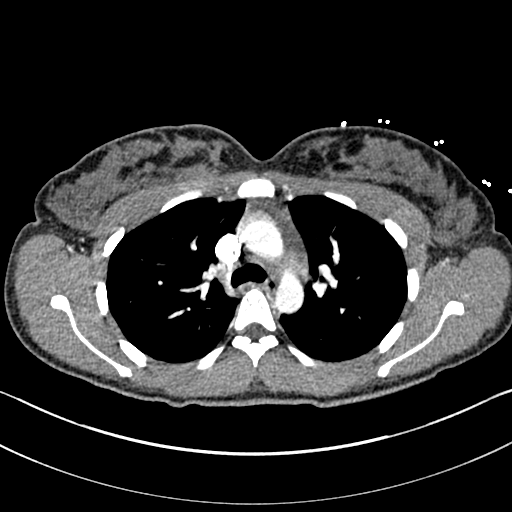
[im 232/295  lung]
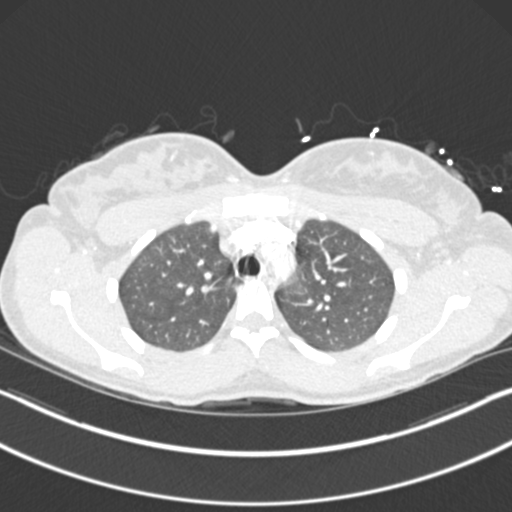
[im 253/295  mediastinal]
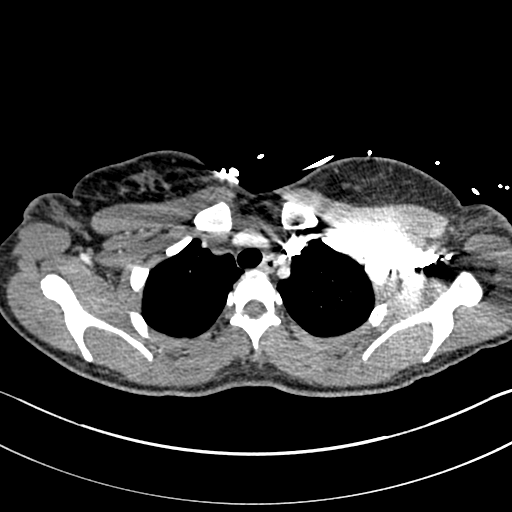
[im 274/295  lung]
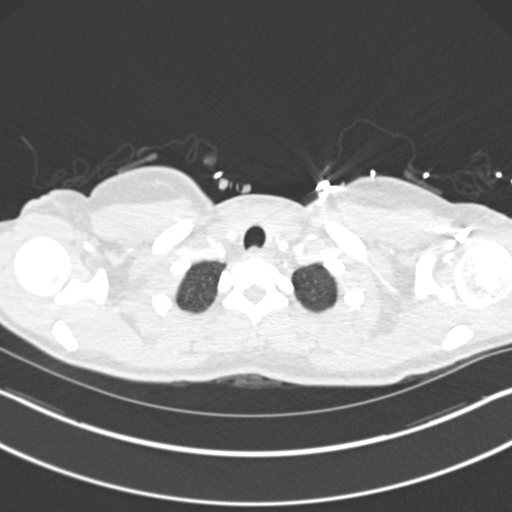

[13 of 38 positions shown; findings below may reference images not displayed]

FINDINGS: Cardiovascular: Satisfactory opacification the pulmonary arteries.
Filling defect in the posterior basal segmental pulmonary artery of
the right lower lobe with extension into the subsegmental branches.
No proximal propagation or other significant filling defects are
seen within the right or left lung. Central pulmonary arteries are
normal caliber. Normal heart size. No pericardial effusion. The
aorta is normal caliber. Normal 3 vessel branching of the aortic
arch. Normal heart size. No pericardial effusion.

Mediastinum/Nodes: No mediastinal fluid or gas. Normal thyroid gland
and thoracic inlet. No acute abnormality of the trachea or
esophagus. No worrisome mediastinal, hilar or axillary adenopathy.

Lungs/Pleura: Wedge-shaped region of opacity in the peripheral basal
segment of the right lower lobe, corresponding well to the vascular
distribution of the pulmonary artery filling defect as detailed
above, is most compatible with a pulmonary infarct. Lungs are
otherwise clear. No pneumothorax or effusion. No concerning
pulmonary nodules or masses.

Upper Abdomen: No acute abnormalities present in the visualized
portions of the upper abdomen.

Musculoskeletal: No acute osseous abnormality or suspicious osseous
lesion.

Review of the MIP images confirms the above findings.
IMPRESSION: 1. Acute pulmonary artery filling defect in the posterior basal
segmental pulmonary artery of the right lower lobe with extension
into the subsegmental branches.
2. Wedge-shaped region of opacity in the peripheral basal segment of
the right lower lobe corresponding well to the vascular distribution
of the pulmonary artery filling defect most compatible with a
pulmonary infarct.

Critical Value/emergent results were called by telephone at the time
of interpretation on 04/10/2020 at [DATE] to provider FLOWER
CALEB , who verbally acknowledged these results.
